# Patient Record
Sex: Female | Born: 1995 | Race: White | Hispanic: No | Marital: Single | Smoking: Never smoker
Health system: Southern US, Community
[De-identification: ages and names within clinical notes are randomized; demographics above are authoritative.]

## PROBLEM LIST (undated history)

## (undated) DIAGNOSIS — G43909 Migraine, unspecified, not intractable, without status migrainosus: Secondary | ICD-10-CM

## (undated) DIAGNOSIS — F419 Anxiety disorder, unspecified: Secondary | ICD-10-CM

## (undated) HISTORY — PX: OTHER SURGICAL HISTORY: SHX169

## (undated) HISTORY — DX: Migraine, unspecified, not intractable, without status migrainosus: G43.909

---
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
## 2018-07-28 IMAGING — CT CT ANGIO CHEST
2 of 6 series · 18 of 36 positions shown · IV contrast (Omni 300)
Comparison: Chest radiograph 10/21/2016.

CLINICAL DATA: Syncope and lightheadedness.

EXAM:
CT ANGIOGRAPHY CHEST WITH CONTRAST
TECHNIQUE: Multidetector CT imaging of the chest was performed using the
standard protocol during bolus administration of intravenous
contrast. Multiplanar CT image reconstructions and MIPs were
obtained to evaluate the vascular anatomy.
CONTRAST:  100 mL Isovue 370

[Series 8: pe thins · axial · 0.72mm/px · z∈[+939,+1199]mm · 17 of 294 slices shown]
[im 17/294  lung]
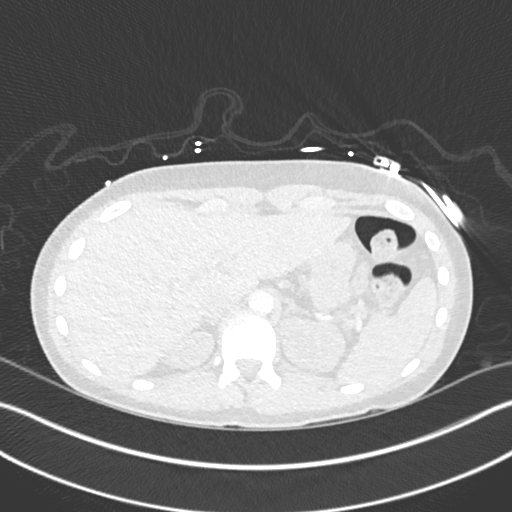
[im 33/294  mediastinal]
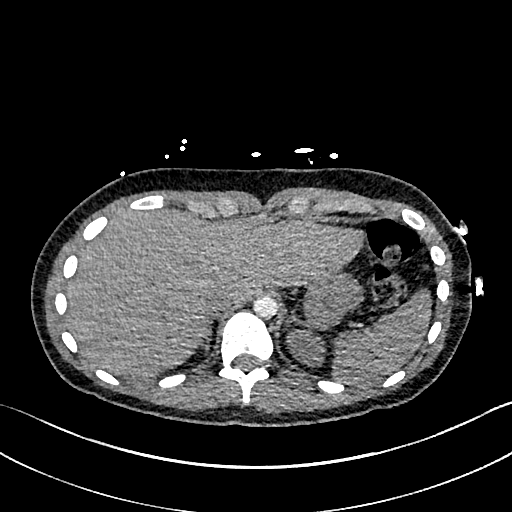
[im 49/294  lung]
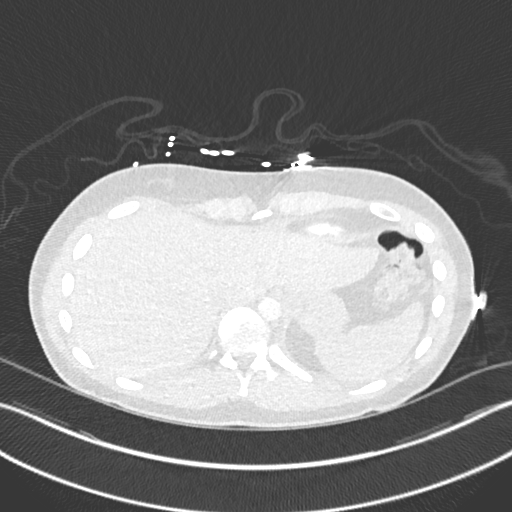
[im 66/294  mediastinal]
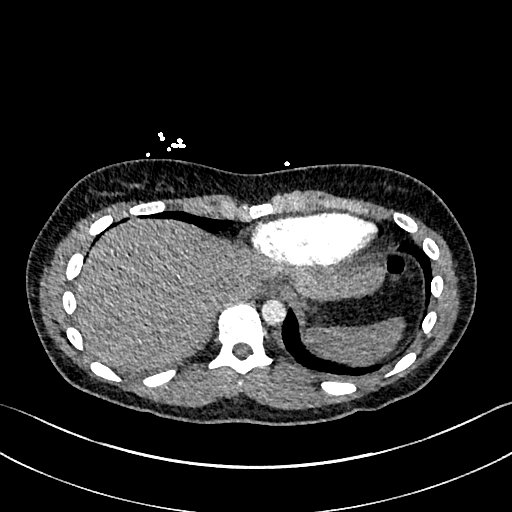
[im 82/294  lung]
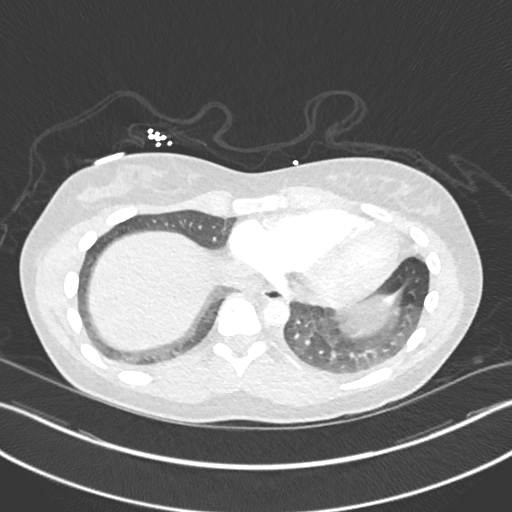
[im 98/294  mediastinal]
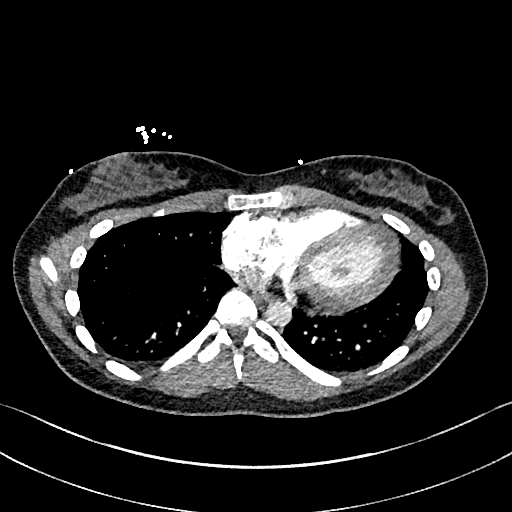
[im 114/294  lung]
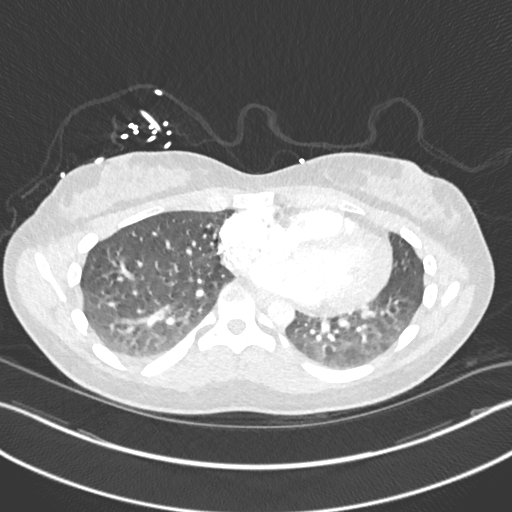
[im 131/294  mediastinal]
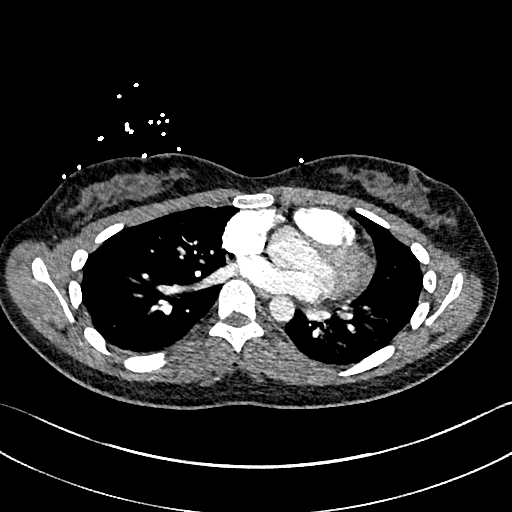
[im 147/294  lung]
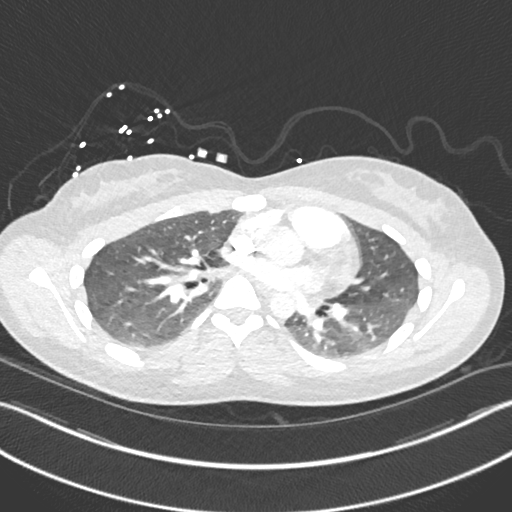
[im 163/294  mediastinal]
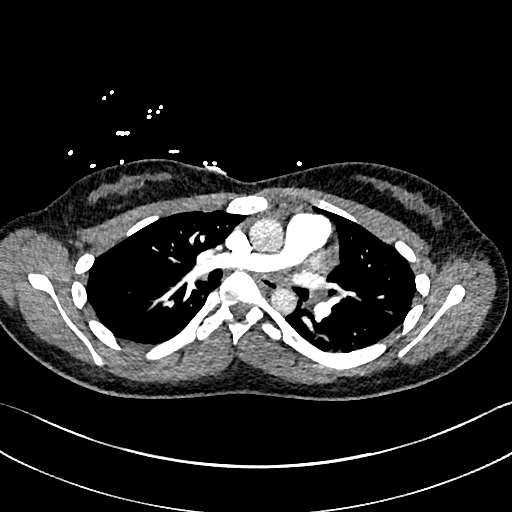
[im 180/294  lung]
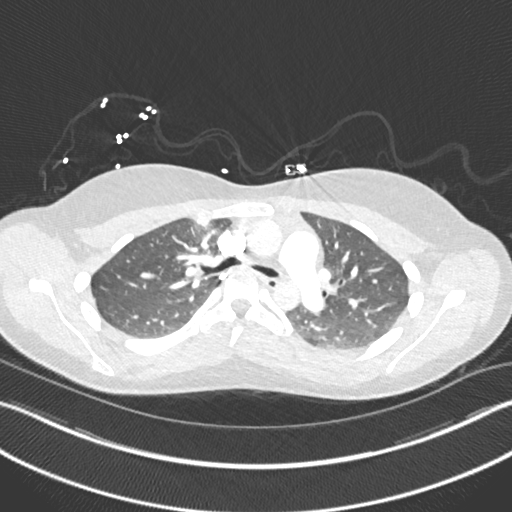
[im 196/294  mediastinal]
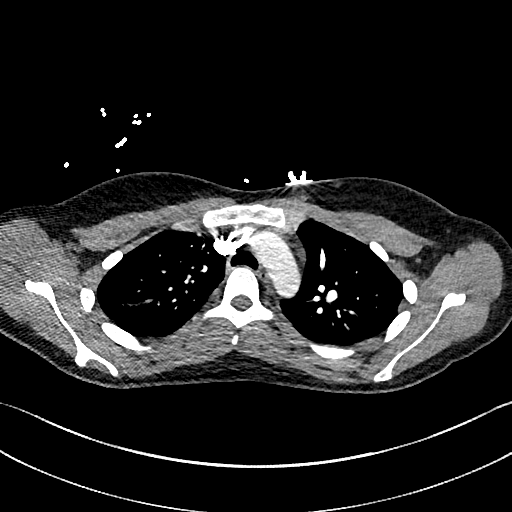
[im 212/294  lung]
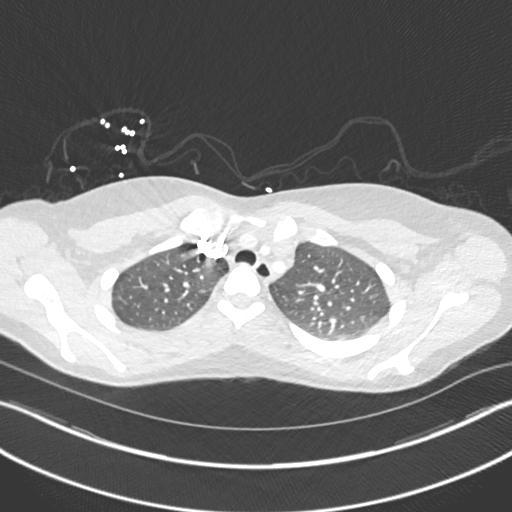
[im 228/294  mediastinal]
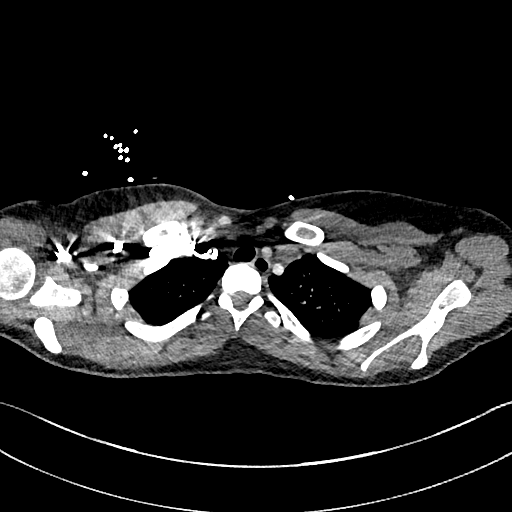
[im 245/294  lung]
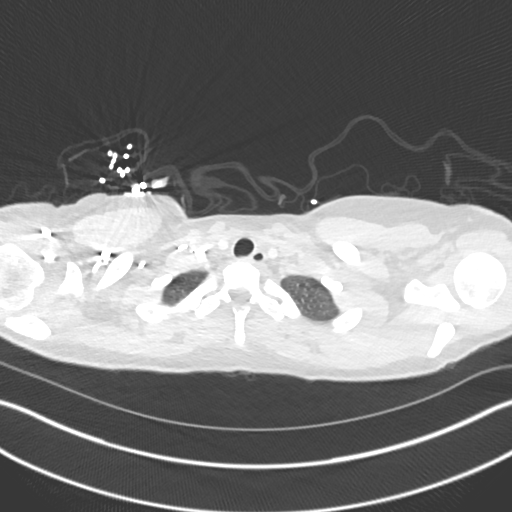
[im 261/294  mediastinal]
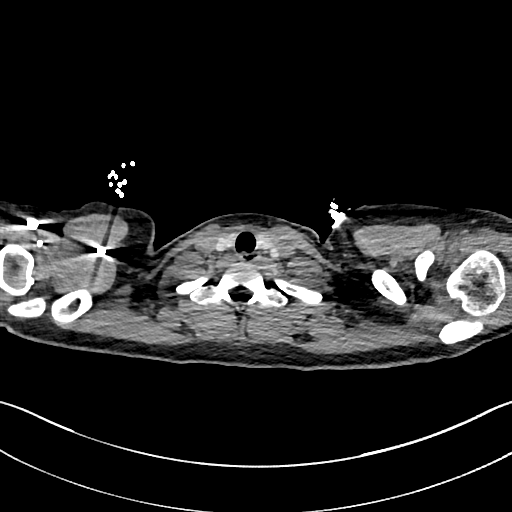
[im 277/294  lung]
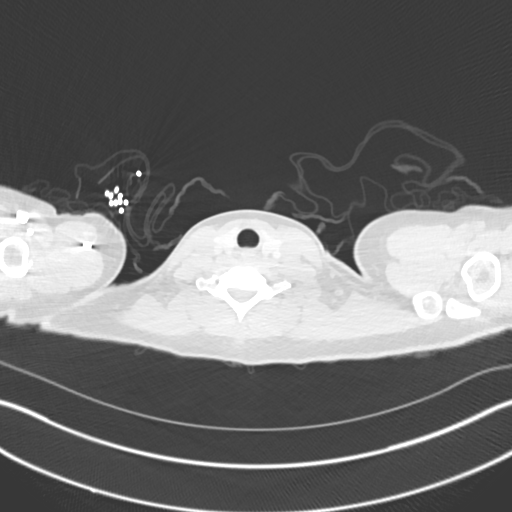

[Series 9: pe 2mm cor · coronal · 0.57mm/px · 1 of 86 slices shown]
[im 43/86  mediastinal]
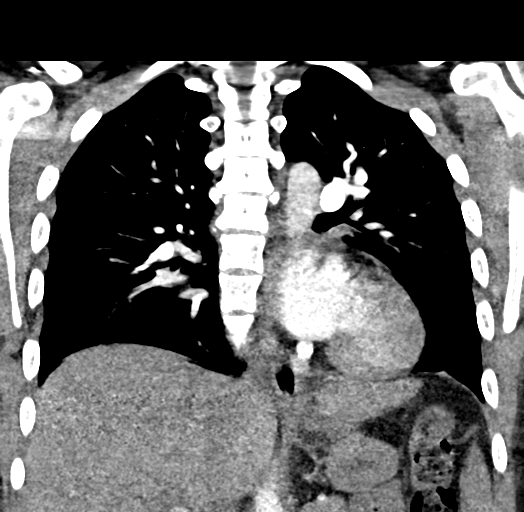

[18 of 36 positions shown; findings below may reference images not displayed]

FINDINGS: Cardiovascular: Contrast injection is sufficient to demonstrate
satisfactory opacification of the pulmonary arteries to the
segmental level. There is no pulmonary embolus. The main pulmonary
artery is within normal limits for size. There is no CT evidence of
acute right heart strain. The visualized aorta is normal. There is a
normal 3-vessel arch branching pattern. Heart size is normal,
without pericardial effusion.

Mediastinum/Nodes: No mediastinal, hilar or axillary
lymphadenopathy. The visualized thyroid and thoracic esophageal
course are unremarkable.

Lungs/Pleura: No pulmonary nodules or masses. No pleural effusion or
pneumothorax. No focal airspace consolidation. No focal pleural
abnormality.

Upper Abdomen: Contrast bolus timing is not optimized for evaluation
of the abdominal organs. Within this limitation, the visualized
organs of the upper abdomen are normal.

Musculoskeletal: The AP dimension of the chest is narrow, suggesting
pectus excavatum.

Review of the MIP images confirms the above findings.
IMPRESSION: 1. No pulmonary embolus or acute thoracic abnormality.
2. Narrowed AP dimension of the chest, possibly indicating pectus
excavatum.

## 2018-10-15 DIAGNOSIS — R102 Pelvic and perineal pain: Secondary | ICD-10-CM | POA: Diagnosis not present

## 2018-10-15 DIAGNOSIS — Z8742 Personal history of other diseases of the female genital tract: Secondary | ICD-10-CM | POA: Diagnosis not present

## 2018-10-29 DIAGNOSIS — R102 Pelvic and perineal pain: Secondary | ICD-10-CM | POA: Diagnosis not present

## 2019-02-27 DIAGNOSIS — Z1159 Encounter for screening for other viral diseases: Secondary | ICD-10-CM | POA: Diagnosis not present

## 2019-02-27 DIAGNOSIS — B349 Viral infection, unspecified: Secondary | ICD-10-CM | POA: Diagnosis not present

## 2019-04-21 DIAGNOSIS — Z30011 Encounter for initial prescription of contraceptive pills: Secondary | ICD-10-CM | POA: Diagnosis not present

## 2019-04-21 DIAGNOSIS — R6882 Decreased libido: Secondary | ICD-10-CM | POA: Diagnosis not present

## 2019-04-21 DIAGNOSIS — N63 Unspecified lump in unspecified breast: Secondary | ICD-10-CM | POA: Diagnosis not present

## 2019-04-21 DIAGNOSIS — Z113 Encounter for screening for infections with a predominantly sexual mode of transmission: Secondary | ICD-10-CM | POA: Diagnosis not present

## 2019-05-14 DIAGNOSIS — Z20828 Contact with and (suspected) exposure to other viral communicable diseases: Secondary | ICD-10-CM | POA: Diagnosis not present

## 2019-05-23 DIAGNOSIS — R55 Syncope and collapse: Secondary | ICD-10-CM | POA: Diagnosis not present

## 2019-06-27 DIAGNOSIS — R197 Diarrhea, unspecified: Secondary | ICD-10-CM | POA: Diagnosis not present

## 2019-06-27 DIAGNOSIS — R112 Nausea with vomiting, unspecified: Secondary | ICD-10-CM | POA: Diagnosis not present

## 2019-07-08 ENCOUNTER — Ambulatory Visit: Payer: BC Managed Care – PPO | Admitting: Neurology

## 2019-08-06 ENCOUNTER — Encounter: Payer: Self-pay | Admitting: Neurology

## 2019-08-06 ENCOUNTER — Other Ambulatory Visit: Payer: Self-pay

## 2019-08-06 ENCOUNTER — Ambulatory Visit (INDEPENDENT_AMBULATORY_CARE_PROVIDER_SITE_OTHER): Payer: BC Managed Care – PPO | Admitting: Neurology

## 2019-08-06 VITALS — BP 119/74 | HR 88 | Temp 97.4°F | Ht 67.0 in | Wt 176.0 lb

## 2019-08-06 DIAGNOSIS — F411 Generalized anxiety disorder: Secondary | ICD-10-CM

## 2019-08-06 DIAGNOSIS — R419 Unspecified symptoms and signs involving cognitive functions and awareness: Secondary | ICD-10-CM | POA: Diagnosis not present

## 2019-08-06 DIAGNOSIS — R55 Syncope and collapse: Secondary | ICD-10-CM

## 2019-08-06 DIAGNOSIS — G4761 Periodic limb movement disorder: Secondary | ICD-10-CM | POA: Diagnosis not present

## 2019-08-06 NOTE — Progress Notes (Signed)
[facility]    Provider:  Dr Jaynee Eagles Requesting Provider: Shanon Rosser, PA-C Primary Care Provider:  System, Pcp Not In  CC:  syncope  HPI:  Lori Avila is a 24 y.o. female here as requested by Shanon Rosser, PA-C for episode of loss of consciousness. In 2018 she was waiting for graduation, standing for long periods of time, a good 1-2 hours in the heat and she just drops, she faints a lot with blood, heights anything that puts her anxiety through the roof. She started feeling lightheaded, dizzy, she knew that she was going to pass out, told boyfriend to catch her, she didn't hit the floor. Usually sitting on the floor helps with these incidents or laying on the floor, starts getting sweaty, super pale. She works from home, she works for [employer] she was talking to an episode and she turned her head and she knew it was going to happen and hit her head on the desk. No biting of tongue, no urination or defecation, no post-ictal confusion, no reports of abnormal movements during the loss of consciousness. She says she can has "horse kicks" in her sleep and throughout the day, bilateral, can happen in the legs and the arms. Happens every day. More prominent at night. She has terrible anxiety and she can;t find aything to help. She has tried SSRI recommend psychiatry.No other focal neurologic deficits, associated symptoms, inciting events or modifiable factors.  Review of Systems: Patient complains of symptoms per HPI as well as the following symptoms: anxiety. Pertinent negatives and positives per HPI. All others negative.   Social History   Socioeconomic History  . Marital status: Single    Spouse name: Not on file  . Number of children: 0  . Years of education: Not on file  . Highest education level: High school graduate  Occupational History  . Not on file  Tobacco Use  . Smoking status: Never Smoker  . Smokeless tobacco: Never Used  Substance and Sexual Activity  .  Alcohol use: Yes    Alcohol/week: 1.0 - 2.0 standard drinks    Types: 1 - 2 Glasses of wine per week  . Drug use: Never  . Sexual activity: Not on file  Other Topics Concern  . Not on file  Social History Narrative   Lives at home with boyfriend   Right handed   Caffeine: 1-3 glasses of tea & coffee per week   Social Determinants of Health   Financial Resource Strain:   . Difficulty of Paying Living Expenses: Not on file  Food Insecurity:   . Worried About Charity fundraiser in the Last Year: Not on file  . Ran Out of Food in the Last Year: Not on file  Transportation Needs:   . Lack of Transportation (Medical): Not on file  . Lack of Transportation (Non-Medical): Not on file  Physical Activity:   . Days of Exercise per Week: Not on file  . Minutes of Exercise per Session: Not on file  Stress:   . Feeling of Stress : Not on file  Social Connections:   . Frequency of Communication with Friends and Family: Not on file  . Frequency of Social Gatherings with Friends and Family: Not on file  . Attends Religious Services: Not on file  . Active Member of Clubs or Organizations: Not on file  . Attends Archivist Meetings: Not on file  . Marital Status: Not on file  Intimate Partner Violence:   .  Fear of Current or Ex-Partner: Not on file  . Emotionally Abused: Not on file  . Physically Abused: Not on file  . Sexually Abused: Not on file    Family History  Problem Relation Age of Onset  . Other Sister        breast mass bilateral (benign)  . Other Paternal Grandmother        breast mass (benign)  . Uterine cancer Paternal Grandmother   . Breast cancer Paternal Aunt   . Polycystic ovary syndrome Paternal Aunt     Past Medical History:  Diagnosis Date  . Anxiety   . Migraine     Patient Active Problem List   Diagnosis Date Noted  . Syncope and collapse 08/08/2019  . Generalized anxiety disorder 08/08/2019    Past Surgical History:  Procedure Laterality  Date  . cyst removal from L eyelid      Current Outpatient Medications  Medication Sig Dispense Refill  . drospirenone-ethinyl estradiol (YAZ) 3-0.02 MG tablet Take 1 tablet by mouth daily.    Marland Kitchen albuterol (PROVENTIL HFA;VENTOLIN HFA) 108 (90 Base) MCG/ACT inhaler Inhale 1-2 puffs into the lungs every 6 (six) hours as needed for wheezing or shortness of breath.     No current facility-administered medications for this visit.    Allergies as of 08/06/2019 - Review Complete 08/06/2019  Allergen Reaction Noted  . Codeine Hives 08/06/2019    Vitals: BP 119/74 (BP Location: Right Arm, Patient Position: Sitting)   Pulse 88   Temp (!) 97.4 F (36.3 C) Comment: taken at front  Ht 5\' 7"  (1.702 m)   Wt 176 lb (79.8 kg)   BMI 27.57 kg/m  Last Weight:  Wt Readings from Last 1 Encounters:  08/06/19 176 lb (79.8 kg)   Last Height:   Ht Readings from Last 1 Encounters:  08/06/19 5\' 7"  (1.702 m)     Physical exam: Exam: Gen: NAD, conversant, well nourised, well groomed                     CV: RRR, no MRG. No Carotid Bruits. No peripheral edema, warm, nontender Eyes: Conjunctivae clear without exudates or hemorrhage  Neuro: Detailed Neurologic Exam  Speech:    Speech is normal; fluent and spontaneous with normal comprehension.  Cognition:    The patient is oriented to person, place, and time;     recent and remote memory intact;     language fluent;     normal attention, concentration,     fund of knowledge Cranial Nerves:    The pupils are equal, round, and reactive to light. The fundi are normal and spontaneous venous pulsations are present. Visual fields are full to finger confrontation. Extraocular movements are intact. Trigeminal sensation is intact and the muscles of mastication are normal. The face is symmetric. The palate elevates in the midline. Hearing intact. Voice is normal. Shoulder shrug is normal. The tongue has normal motion without fasciculations.    Coordination:    Normal finger to nose and heel to shin. Normal rapid alternating movements.   Gait:    Heel-toe and tandem gait are normal.   Motor Observation:    No asymmetry, no atrophy, and no involuntary movements noted. Tone:    Normal muscle tone.    Posture:    Posture is normal. normal erect    Strength:    Strength is V/V in the upper and lower limbs.      Sensation: intact to LT  Reflex Exam:  DTR's:    Deep tendon reflexes in the upper and lower extremities are normal bilaterally.   Toes:    The toes are downgoing bilaterally.   Clonus:    Clonus is absent.    Assessment/Plan:  Patient with syncope likely vasovagal but needs workup. History of significant anxiety.   EEG: Routine in the office and if she does not have jerking or her symptoms during the routine want her to contact me and we will do a 24-hour eeg ambulatory or ask Dr. Vickey Huger for eeg protocol during a sleep study. She has had twitching since a baby. Also episodes of "zoing out"  Sleep evaluation: She says she can has "horse kicks" in her sleep and throughout the day, bilateral, can happen in the legs and the arms. Happens every day. More prominent at night. Sleep evaluation to Dr. Vickey Huger for PLMS and possibly add on an eeg protocol during the sleep exam  Anxiety: She has "terrible anxiety": recommend psychiatry  Reports obgyn checked tsh and other labs within the last year and normal, we will request  Discussed:  Per [facility] statutes, patients with seizures are not allowed to drive until they have been seizure-free for six months.    Use caution when using heavy equipment or power tools. Avoid working on ladders or at heights. Take showers instead of baths. Ensure the water temperature is not too high on the home water heater. Do not go swimming alone. Do not lock yourself in a room alone (i.e. bathroom). When caring for infants or small children, sit down when holding,  feeding, or changing them to minimize risk of injury to the child in the event you have a seizure. Maintain good sleep hygiene. Avoid alcohol.    If patient has another seizure, call 911 and bring them back to the ED if: A.  The seizure lasts longer than 5 minutes.      B.  The patient doesn't wake shortly after the seizure or has new problems such as difficulty seeing, speaking or moving following the seizure C.  The patient was injured during the seizure D.  The patient has a temperature over 102 F (39C) E.  The patient vomited during the seizure and now is having trouble breathing  Per [facility] statutes, patients with seizures are not allowed to drive until they have been seizure-free for six months.  Other recommendations include using caution when using heavy equipment or power tools. Avoid working on ladders or at heights. Take showers instead of baths.  Do not swim alone.  Ensure the water temperature is not too high on the home water heater. Do not go swimming alone. Do not lock yourself in a room alone (i.e. bathroom). When caring for infants or small children, sit down when holding, feeding, or changing them to minimize risk of injury to the child in the event you have a seizure. Maintain good sleep hygiene. Avoid alcohol.  Also recommend adequate sleep, hydration, good diet and minimize stress.  During the Seizure  - First, ensure adequate ventilation and place patients on the floor on their left side  Loosen clothing around the neck and ensure the airway is patent. If the patient is clenching the teeth, do not force the mouth open with any object as this can cause severe damage - Remove all items from the surrounding that can be hazardous. The patient may be oblivious to what's happening and may not even know what he or  she is doing. If the patient is confused and wandering, either gently guide him/her away and block access to outside areas - Reassure the individual and be  comforting - Call 911. In most cases, the seizure ends before EMS arrives. However, there are cases when seizures may last over 3 to 5 minutes. Or the individual may have developed breathing difficulties or severe injuries. If a pregnant patient or a person with diabetes develops a seizure, it is prudent to call an ambulance. - Finally, if the patient does not regain full consciousness, then call EMS. Most patients will remain confused for about 45 to 90 minutes after a seizure, so you must use judgment in calling for help. - Avoid restraints but make sure the patient is in a bed with padded side rails - Place the individual in a lateral position with the neck slightly flexed; this will help the saliva drain from the mouth and prevent the tongue from falling backward - Remove all nearby furniture and other hazards from the area - Provide verbal assurance as the individual is regaining consciousness - Provide the patient with privacy if possible - Call for help and start treatment as ordered by the caregiver   fter the Seizure (Postictal Stage)  After a seizure, most patients experience confusion, fatigue, muscle pain and/or a headache. Thus, one should permit the individual to sleep. For the next few days, reassurance is essential. Being calm and helping reorient the person is also of importance.  Most seizures are painless and end spontaneously. Seizures are not harmful to others but can lead to complications such as stress on the lungs, brain and the heart. Individuals with prior lung problems may develop labored breathing and respiratory distress.    Orders Placed This Encounter  Procedures  . MR BRAIN W WO CONTRAST  . Ambulatory referral to Sleep Studies  . EEG    Cc: Long, Conway, PA-C  Naomie Dean, MD  [facility] [address] [place], [place] [address]  Phone [phone] Fax [phone]

## 2019-08-06 NOTE — Patient Instructions (Addendum)
MRI of the brain w/wo contrat EEG routine in the office Dr. Vickey Huger for sleep evaluation   Syncope  Syncope refers to a condition in which a person temporarily loses consciousness. Syncope may also be called fainting or passing out. It is caused by a sudden decrease in blood flow to the brain. Even though most causes of syncope are not dangerous, syncope can be a sign of a serious medical problem. Your health care provider may do tests to find the reason why you are having syncope. Signs that you may be about to faint include:  Feeling dizzy or light-headed.  Feeling nauseous.  Seeing all white or all black in your field of vision.  Having cold, clammy skin. If you faint, get medical help right away. Call your local emergency services (911 in the [place] Do not drive yourself to the hospital. Follow these instructions at home: Pay attention to any changes in your symptoms. Take these actions to stay safe and to help relieve your symptoms: Lifestyle  Do not drive, use machinery, or play sports until your health care provider says it is okay.  Do not drink alcohol.  Do not use any products that contain nicotine or tobacco, such as cigarettes and e-cigarettes. If you need help quitting, ask your health care provider.  Drink enough fluid to keep your urine pale yellow. General instructions  Take over-the-counter and prescription medicines only as told by your health care provider.  If you are taking blood pressure or heart medicine, get up slowly and take several minutes to sit and then stand. This can reduce dizziness or light-headedness.  Have someone stay with you until you feel stable.  If you start to feel like you might faint, lie down right away and raise (elevate) your feet above the level of your heart. Breathe deeply and steadily. Wait until all the symptoms have passed.  Keep all follow-up visits as told by your health care provider. This is important. Get help right  away if you:  Have a severe headache.  Faint once or repeatedly.  Have pain in your chest, abdomen, or back.  Have a very fast or irregular heartbeat (palpitations).  Have pain when you breathe.  Are bleeding from your mouth or rectum, or you have black or tarry stool.  Have a seizure.  Are confused.  Have trouble walking.  Have severe weakness.  Have vision problems. These symptoms may represent a serious problem that is an emergency. Do not wait to see if your symptoms will go away. Get medical help right away. Call your local emergency services (911 in the [place] Do not drive yourself to the hospital. Summary  Syncope refers to a condition in which a person temporarily loses consciousness. It is caused by a sudden decrease in blood flow to the brain.  Signs that you may be about to faint include dizziness, feeling light-headed, feeling nauseous, sudden vision changes, or cold, clammy skin.  Although most causes of syncope are not dangerous, syncope can be a sign of a serious medical problem. If you faint, get medical help right away. This information is not intended to replace advice given to you by your health care provider. Make sure you discuss any questions you have with your health care provider. Document Revised: 05/04/2017 Document Reviewed: 04/30/2017 Elsevier Patient Education  2020 [employer].

## 2019-08-08 ENCOUNTER — Encounter: Payer: Self-pay | Admitting: Neurology

## 2019-08-08 DIAGNOSIS — R55 Syncope and collapse: Secondary | ICD-10-CM | POA: Insufficient documentation

## 2019-08-08 DIAGNOSIS — F411 Generalized anxiety disorder: Secondary | ICD-10-CM | POA: Insufficient documentation

## 2019-08-18 ENCOUNTER — Other Ambulatory Visit: Payer: Self-pay | Admitting: Neurology

## 2019-08-18 ENCOUNTER — Telehealth: Payer: Self-pay | Admitting: Neurology

## 2019-08-18 MED ORDER — ALPRAZOLAM 0.25 MG PO TABS: ORAL_TABLET | ORAL | 0 refills | Status: AC

## 2019-08-18 NOTE — Telephone Encounter (Signed)
Pt aware Xanax was sent to [facility] on [address]. Questions answered. She understands the instructions and possible s/e such as drowsiness, dizziness/loopy feeling.

## 2019-08-18 NOTE — Telephone Encounter (Signed)
no to the covid questions MR Brain w/wo contrast Dr. Valaria Good Auth: [id number] (exp. 08/14/19 to 09/12/19). Patient is scheduled at [facility] for 08/26/19.

## 2019-08-18 NOTE — Telephone Encounter (Signed)
Also patient informed me she is claustrophobic and would need something to help her with.. she is aware to have a driver.

## 2019-08-18 NOTE — Telephone Encounter (Signed)
Xanax ordered thanks

## 2019-08-18 NOTE — Telephone Encounter (Signed)
Noted thank you

## 2019-08-25 ENCOUNTER — Other Ambulatory Visit: Payer: Self-pay

## 2019-08-25 ENCOUNTER — Ambulatory Visit (INDEPENDENT_AMBULATORY_CARE_PROVIDER_SITE_OTHER): Payer: BC Managed Care – PPO | Admitting: Neurology

## 2019-08-25 DIAGNOSIS — R55 Syncope and collapse: Secondary | ICD-10-CM

## 2019-08-26 ENCOUNTER — Ambulatory Visit: Payer: BC Managed Care – PPO

## 2019-08-26 DIAGNOSIS — R419 Unspecified symptoms and signs involving cognitive functions and awareness: Secondary | ICD-10-CM | POA: Diagnosis not present

## 2019-08-26 DIAGNOSIS — G4761 Periodic limb movement disorder: Secondary | ICD-10-CM

## 2019-08-26 DIAGNOSIS — R55 Syncope and collapse: Secondary | ICD-10-CM

## 2019-08-26 MED ORDER — GADOBENATE DIMEGLUMINE 529 MG/ML IV SOLN
15.0000 mL | Freq: Once | INTRAVENOUS | Status: AC | PRN
  Administered 2019-08-26: 15 mL via INTRAVENOUS

## 2019-08-28 NOTE — Procedures (Signed)
Referring physician is Dr. Naomie Dean, MD.  This is an EEG recording from 25 August 2019 is a total recording time of 28 minutes and 4 seconds.  This is a 20 electrode video EEG with a single channel dedicated to limited EKG on nadirs equipment.  The video was provided through a separate screen and camera.  The electrodes were placed in accordance with the international 10-20 system this additional temporal electrodes.  This recording begins with the patient in awake state, background and central leads show symmetric activity -there is no well-formed anterior to posterior gradient due to constant motion artifact.   The EEG has an unusual low amplitude for a young patient, the excessive eye blink artifact seizes for a moment when the patient is asked to close her eyes.   Now a posterior dominant background rhythm of 11 Hz becomes visible.   With renewed eye opening, the constant facial movements continue without any reference or changes of the EEG.  Hyperventilation maneuver is done,  no amplitude buildup is noted, and for 2 minutes after the hyperventilation maneuver was  completed there is still no effect noted. No drowsiness.  Photic stimulation is done beginning.  Again excessive blinking artifact, symmetric entrainment in all frequencies.  The patient appears hypervigilant.  No sleep was recorded at any time during this EEG.  This EEG does not show any epileptiform activity, there is a lot of motion artifact, no sleep has been recorded.  All activity appear symmetric. EKG is regular, between 68 and 67 bpm.   Melvyn Novas MD

## 2019-09-03 ENCOUNTER — Encounter: Payer: Self-pay | Admitting: *Deleted

## 2019-09-03 ENCOUNTER — Telehealth: Payer: Self-pay | Admitting: *Deleted

## 2019-09-03 NOTE — Telephone Encounter (Signed)
Called pt & LVM (ok per DPR) advising her EEG was normal, no seizure activity. Also left message saying MRI brain normal. Left office number for call back if pt has any questions about either of these results.

## 2019-09-03 NOTE — Telephone Encounter (Signed)
-----   Message from Anson Fret, MD sent at 09/01/2019  8:32 AM EDT ----- EEG is normal, thanks

## 2019-09-03 NOTE — Telephone Encounter (Signed)
-----   Message from Antonia B Ahern, MD sent at 09/01/2019  8:32 AM EDT ----- EEG is normal, thanks 

## 2019-09-08 ENCOUNTER — Institutional Professional Consult (permissible substitution): Payer: BC Managed Care – PPO | Admitting: Neurology
